# Patient Record
Sex: Male | Born: 1985 | Race: White | Hispanic: No | Marital: Single | Smoking: Current every day smoker
Health system: Southern US, Community
[De-identification: ages and names within clinical notes are randomized; demographics above are authoritative.]

---
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
## 2011-08-10 ENCOUNTER — Observation Stay (HOSPITAL_COMMUNITY): Payer: BC Managed Care – PPO

## 2011-08-10 ENCOUNTER — Observation Stay (HOSPITAL_COMMUNITY)
Admission: EM | Admit: 2011-08-10 | Discharge: 2011-08-10 | Disposition: A | Discharge status: Home / Self Care | Payer: BC Managed Care – PPO | Source: Ambulatory Visit | Attending: Emergency Medicine | Admitting: Emergency Medicine

## 2011-08-10 DIAGNOSIS — IMO0002 Reserved for concepts with insufficient information to code with codable children: Principal | ICD-10-CM | POA: Insufficient documentation

## 2011-08-10 LAB — POCT I-STAT, CHEM 8
BUN: 15 mg/dL (ref 6–23)
Calcium, Ion: 1.18 mmol/L (ref 1.12–1.32)
Chloride: 101 mEq/L (ref 96–112)
Creatinine, Ser: 1.5 mg/dL — ABNORMAL HIGH (ref 0.50–1.35)
Glucose, Bld: 92 mg/dL (ref 70–99)
HCT: 43 % (ref 39.0–52.0)
Hemoglobin: 14.6 g/dL (ref 13.0–17.0)
Potassium: 4.5 mEq/L (ref 3.5–5.1)
Sodium: 137 mEq/L (ref 135–145)
TCO2: 25 mmol/L (ref 0–100)

## 2011-08-10 LAB — CBC
HCT: 40.4 % (ref 39.0–52.0)
Hemoglobin: 14 g/dL (ref 13.0–17.0)
MCH: 32.3 pg (ref 26.0–34.0)
MCHC: 34.7 g/dL (ref 30.0–36.0)
MCV: 93.1 fL (ref 78.0–100.0)
Platelets: 186 10*3/uL (ref 150–400)
RBC: 4.34 MIL/uL (ref 4.22–5.81)
RDW: 12.9 % (ref 11.5–15.5)
WBC: 6.5 10*3/uL (ref 4.0–10.5)

## 2011-08-10 LAB — DIFFERENTIAL
Basophils Absolute: 0 10*3/uL (ref 0.0–0.1)
Basophils Relative: 1 % (ref 0–1)
Eosinophils Absolute: 0.2 10*3/uL (ref 0.0–0.7)
Eosinophils Relative: 2 % (ref 0–5)
Lymphocytes Relative: 16 % (ref 12–46)
Lymphs Abs: 1 10*3/uL (ref 0.7–4.0)
Monocytes Absolute: 0.6 10*3/uL (ref 0.1–1.0)
Monocytes Relative: 10 % (ref 3–12)
Neutro Abs: 4.7 10*3/uL (ref 1.7–7.7)
Neutrophils Relative %: 72 % (ref 43–77)

## 2011-08-13 LAB — WOUND CULTURE

## 2012-01-07 ENCOUNTER — Emergency Department (HOSPITAL_COMMUNITY)
Admission: EM | Admit: 2012-01-07 | Discharge: 2012-01-07 | Disposition: A | Discharge status: Home Health | Payer: BC Managed Care – PPO | Attending: Emergency Medicine | Admitting: Emergency Medicine

## 2012-01-07 ENCOUNTER — Encounter (HOSPITAL_COMMUNITY): Payer: Self-pay | Admitting: *Deleted

## 2012-01-07 DIAGNOSIS — IMO0002 Reserved for concepts with insufficient information to code with codable children: Secondary | ICD-10-CM | POA: Insufficient documentation

## 2012-01-07 DIAGNOSIS — F17200 Nicotine dependence, unspecified, uncomplicated: Secondary | ICD-10-CM | POA: Insufficient documentation

## 2012-01-07 DIAGNOSIS — M79609 Pain in unspecified limb: Secondary | ICD-10-CM | POA: Insufficient documentation

## 2012-01-07 DIAGNOSIS — M7989 Other specified soft tissue disorders: Secondary | ICD-10-CM | POA: Insufficient documentation

## 2012-01-07 DIAGNOSIS — L02519 Cutaneous abscess of unspecified hand: Secondary | ICD-10-CM | POA: Insufficient documentation

## 2012-01-07 DIAGNOSIS — L03019 Cellulitis of unspecified finger: Secondary | ICD-10-CM | POA: Insufficient documentation

## 2012-01-07 MED ORDER — CEPHALEXIN 250 MG PO CAPS
500.0000 mg | ORAL_CAPSULE | Freq: Once | ORAL | Status: AC
  Administered 2012-01-07: 500 mg via ORAL
  Filled 2012-01-07: qty 2

## 2012-01-07 MED ORDER — HYDROCODONE-ACETAMINOPHEN 5-325 MG PO TABS: 2.0000 | ORAL_TABLET | Freq: Four times a day (QID) | ORAL | Status: AC | PRN

## 2012-01-07 MED ORDER — CEPHALEXIN 500 MG PO CAPS: 500.0000 mg | ORAL_CAPSULE | Freq: Four times a day (QID) | ORAL | Status: AC

## 2012-01-07 MED ORDER — SULFAMETHOXAZOLE-TMP DS 800-160 MG PO TABS
2.0000 | ORAL_TABLET | Freq: Once | ORAL | Status: AC
  Administered 2012-01-07: 2 via ORAL
  Filled 2012-01-07: qty 2

## 2012-01-07 MED ORDER — HYDROCODONE-ACETAMINOPHEN 5-325 MG PO TABS
2.0000 | ORAL_TABLET | Freq: Once | ORAL | Status: AC
  Administered 2012-01-07: 2 via ORAL
  Filled 2012-01-07: qty 2

## 2012-01-07 MED ORDER — SULFAMETHOXAZOLE-TMP DS 800-160 MG PO TABS: 2.0000 | ORAL_TABLET | Freq: Two times a day (BID) | ORAL | Status: AC

## 2012-01-07 NOTE — ED Provider Notes (Signed)
History     CSN: [id number]  Arrival date & time 01/07/12  1458   First MD Initiated Contact with Patient 01/07/12 1603      Chief Complaint  Patient presents with  . Hand Pain    (Consider location/radiation/quality/duration/timing/severity/associated sxs/prior treatment) HPI Patient is a 26 yo M who presents today with complaint of increasing swelling and pain of his right little finger.  Patient was seen at urgent care for this several days ago and was started on bactrim 1 tab po BID.  He rates his pain as a 7/10 and notes increasing swelling and fluctuance.  He is able to flex and extend at the PIP and DIP.  He denies any systemic signs of infection.  Pain is worse with movement and palpation. There are no other associated or modifying factors.  History reviewed. No pertinent past medical history.  History reviewed. No pertinent past surgical history.  History reviewed. No pertinent family history.  History  Substance Use Topics  . Smoking status: Current Everyday Smoker    Types: Cigarettes  . Smokeless tobacco: Not on file  . Alcohol Use: No      Review of Systems  Constitutional: Negative.   HENT: Negative.   Eyes: Negative.   Respiratory: Negative.   Cardiovascular: Negative.   Gastrointestinal: Negative.   Genitourinary: Negative.   Musculoskeletal:       See HPI  Skin: Negative.   Neurological: Negative.   Hematological: Negative.   Psychiatric/Behavioral: Negative.   All other systems reviewed and are negative.    Allergies  Review of patient's allergies indicates no known allergies.  Home Medications   Current Outpatient Rx  Name Route Sig Dispense Refill  . IBUPROFEN 200 MG PO TABS Oral Take 400 mg by mouth every 8 (eight) hours as needed. For pain.    . SULFAMETHOXAZOLE-TMP DS 800-160 MG PO TABS Oral Take 1 tablet by mouth 2 (two) times daily. For 10 days Picked up 3/22    . CEPHALEXIN 500 MG PO CAPS Oral Take 1 capsule (500 mg total) by  mouth 4 (four) times daily. 40 capsule 0  . HYDROCODONE-ACETAMINOPHEN 5-325 MG PO TABS Oral Take 2 tablets by mouth every 6 (six) hours as needed for pain. 30 tablet 0  . SULFAMETHOXAZOLE-TMP DS 800-160 MG PO TABS Oral Take 2 tablets by mouth 2 (two) times daily. 40 tablet 0    BP 123/50  Pulse 58  Temp(Src) 98.4 F (36.9 C) (Oral)  Resp 14  Ht 5\' 11"  (1.803 m)  Wt 175 lb (79.379 kg)  BMI 24.41 kg/m2  SpO2 100%  Physical Exam  Nursing note and vitals reviewed. GEN: Well-developed, well-nourished male in no distress HEENT: Atraumatic, normocephalic.  EYES: PERRLA BL, no scleral icterus. NECK: Trachea midline, no meningismus CV: regular rate  PULM: No respiratory distress.   Neuro: cranial nerves 2-12 intact, no abnormalities of strength or sensation, A and O x 3 MSK: right hand with erythema distal to the DIP of the little finger. Fluctuance is appreciable just proximal to the edge of the nail bed.  There is also a small healed puncture wound on the radial aspect of the finger at the midpoint of the distal phalanx with underlying fluctuance and TTP Skin: see MSK Psych: no abnormality of mood   ED Course  Procedures (including critical care time)  INCISION AND DRAINAGE Performed by: Cyndra Numbers Consent: Verbal consent obtained. Risks and benefits: risks, benefits and alternatives were discussed Type: abscess  Body area:  right fifth finger  Anesthesia: digital block  Local anesthetic: lidocaine 2%   Anesthetic total: 3 ml  Complexity: simple Separation of the proximal nail fold at the nail bed was performed with only bloody drainage but improvement in swelling.  Incision was then made on radial aspect of the finger with return of small amount of pus.  No packing placed given that extending incision was felt unnecessary as swelling had improved and no further drainage could be expressed despite dissection. Blunt dissection to break up loculations  Drainage:  purulent  Drainage amount: minimal  Patient tolerance: Patient tolerated the procedure well with no immediate complications.   Labs Reviewed - No data to display No results found.   1. Cellulitis and abscess of finger   2. Felon       MDM  Presentation was consistent with worsening cellulitis and abscess.  Patient was not on correct antibiotic dosage.  He was started on bactrim DS 2 tabs po BID and keflex.  He had I and D performed.  He was instructed to soak in warm soapy water BID.  He was given pain meds and told to take all of his antibiotics.  The patient was discharged in good condition.     Cyndra Numbers, MD 01/08/12 1316

## 2012-01-07 NOTE — ED Notes (Signed)
Pt c/o infection in right pinky finger.  Went to urgent care two days ago and was given an antibiotic. Pt stated pressure and numbness is increasing and progressively getting worse. Pt denies injuring finger.

## 2012-01-07 NOTE — ED Notes (Signed)
Reports having infection to right little finger, went to u[facility] two days ago and started on antibiotics but no relief. Redness and swelling noted to finger.

## 2012-01-07 NOTE — Discharge Instructions (Signed)
Please soak your affected finger in warm soapy water twice a day for 20 minutes while wound is open. Please take all of your antibiotics. Whenever possible he should elevate your hand. Use ice as needed for symptoms as well. If you develop finger swelling, foul smelling discharge, increasing pain despite antibiotic therapy, or other concerning symptoms please return to the emergency department immediately. Fingertip Infection When an infection is around the nail, it is called a paronychia. When it appears over the tip of the finger, it is called a felon. These infections are due to minor injuries or cracks in the skin. If they are not treated properly, they can lead to bone infection and permanent damage to the fingernail. Incision and drainage is necessary if a pus pocket (an abscess) has formed. Antibiotics and pain medicine may also be needed. Keep your hand elevated for the next 2-3 days to reduce swelling and pain. If a pack was placed in the abscess, it should be removed in 1-2 days by your caregiver. Soak the finger in warm water for 20 minutes 4 times daily to help promote drainage. Keep the hands as dry as possible. Wear protective gloves with cotton liners. See your caregiver for follow-up care as recommended.  HOME CARE INSTRUCTIONS   Keep wound clean, dry and dressed as suggested by your caregiver.   Soak in warm salt water for fifteen minutes, four times per day for bacterial infections.   Your caregiver will prescribe an antibiotic if a bacterial infection is suspected. Take antibiotics as directed and finish the prescription, even if the problem appears to be improving before the medicine is gone.   Only take over-the-counter or prescription medicines for pain, discomfort, or fever as directed by your caregiver.  SEEK IMMEDIATE MEDICAL CARE IF:  There is redness, swelling, or increasing pain in the wound.   Pus or any other unusual drainage is coming from the wound.   An  unexplained oral temperature above 102 F (38.9 C) develops.   You notice a foul smell coming from the wound or dressing.  MAKE SURE YOU:   Understand these instructions.   Monitor your condition.   Contact your caregiver if you are getting worse or not improving.  Document Released: 11/09/2004 Document Revised: 09/21/2011 Document Reviewed: 11/05/2008 [facility] Patient Information 2012 [place], [place].

## 2018-02-26 DIAGNOSIS — K625 Hemorrhage of anus and rectum: Secondary | ICD-10-CM | POA: Diagnosis not present

## 2018-02-26 DIAGNOSIS — Z1389 Encounter for screening for other disorder: Secondary | ICD-10-CM | POA: Diagnosis not present

## 2018-02-26 DIAGNOSIS — Z136 Encounter for screening for cardiovascular disorders: Secondary | ICD-10-CM | POA: Diagnosis not present

## 2018-02-26 DIAGNOSIS — Z0000 Encounter for general adult medical examination without abnormal findings: Secondary | ICD-10-CM | POA: Diagnosis not present

## 2020-11-09 ENCOUNTER — Ambulatory Visit
Admission: EM | Admit: 2020-11-09 | Discharge: 2020-11-09 | Disposition: A | Discharge status: Home / Self Care | Payer: 59 | Attending: Emergency Medicine | Admitting: Emergency Medicine

## 2020-11-09 ENCOUNTER — Ambulatory Visit (INDEPENDENT_AMBULATORY_CARE_PROVIDER_SITE_OTHER): Payer: 59

## 2020-11-09 ENCOUNTER — Encounter: Payer: Self-pay | Admitting: Emergency Medicine

## 2020-11-09 ENCOUNTER — Other Ambulatory Visit: Payer: Self-pay

## 2020-11-09 DIAGNOSIS — W208XXA Other cause of strike by thrown, projected or falling object, initial encounter: Secondary | ICD-10-CM | POA: Diagnosis not present

## 2020-11-09 DIAGNOSIS — M79671 Pain in right foot: Secondary | ICD-10-CM | POA: Diagnosis not present

## 2020-11-09 DIAGNOSIS — S92424A Nondisplaced fracture of distal phalanx of right great toe, initial encounter for closed fracture: Secondary | ICD-10-CM

## 2020-11-09 NOTE — ED Triage Notes (Signed)
Pt states that yesterday morning he was doing house work and slipped on some wood and dropped the corner of the counter top on his right foot. Pt right big toe is bruised and swollen.

## 2020-11-09 NOTE — ED Provider Notes (Signed)
[facility]    CSN: [id number] Arrival date & time: 11/09/20  0955      History   Chief Complaint Chief Complaint  Patient presents with  . Foot Injury    HPI Danny Burch is a 35 y.o. male  Present for right great toe injury that occurred yesterday.  States that he dropped a counter top on his foot while doing renovations.  No open wound, bleeding, numbness or deformity.  Has had history of fractures, though this feels different.  History reviewed. No pertinent past medical history.  There are no problems to display for this patient.   History reviewed. No pertinent surgical history.     Home Medications    Prior to Admission medications   Medication Sig Start Date End Date Taking? Authorizing Provider  ibuprofen (ADVIL,MOTRIN) 200 MG tablet Take 400 mg by mouth every 8 (eight) hours as needed. For pain.    [provider]    Family History History reviewed. No pertinent family history.  Social History Social History   Tobacco Use  . Smoking status: Current Every Day Smoker    Types: Cigarettes  . Smokeless tobacco: Never Used  Substance Use Topics  . Alcohol use: No  . Drug use: No     Allergies   Patient has no known allergies.   Review of Systems Review of Systems  Constitutional: Negative for fatigue and fever.  Respiratory: Negative for cough and shortness of breath.   Cardiovascular: Negative for chest pain and palpitations.  Gastrointestinal: Negative for abdominal pain, diarrhea and vomiting.  Musculoskeletal: Negative for arthralgias and myalgias.       (+) R great toe pain  Skin: Negative for rash and wound.  Neurological: Negative for speech difficulty and headaches.  All other systems reviewed and are negative.    Physical Exam Triage Vital Signs ED Triage Vitals  Enc Vitals Group     BP 11/09/20 1021 131/82     Pulse Rate 11/09/20 1021 76     Resp 11/09/20 1021 17     Temp 11/09/20 1021 97.6 F  (36.4 C)     Temp Source 11/09/20 1021 Oral     SpO2 11/09/20 1021 98 %     Weight --      Height --      Head Circumference --      Peak Flow --      Pain Score 11/09/20 1019 4     Pain Loc --      Pain Edu? --      Excl. in GC? --    No data found.  Updated Vital Signs BP 131/82 (BP Location: Left Arm)   Pulse 76   Temp 97.6 F (36.4 C) (Oral)   Resp 17   SpO2 98%   Visual Acuity Right Eye Distance:   Left Eye Distance:   Bilateral Distance:    Right Eye Near:   Left Eye Near:    Bilateral Near:     Physical Exam Constitutional:      General: He is not in acute distress. HENT:     Head: Normocephalic and atraumatic.  Eyes:     General: No scleral icterus.    Pupils: Pupils are equal, round, and reactive to light.  Cardiovascular:     Rate and Rhythm: Normal rate.  Pulmonary:     Effort: Pulmonary effort is normal. No respiratory distress.     Breath sounds: No wheezing.  Musculoskeletal:  General: Swelling and tenderness present. No deformity.     Comments: R great toe NVI  Skin:    Coloration: Skin is not jaundiced or pale.     Findings: Bruising present.  Neurological:     General: No focal deficit present.     Mental Status: He is alert and oriented to person, place, and time.      UC Treatments / Results  Labs (all labs ordered are listed, but only abnormal results are displayed) Labs Reviewed - No data to display  EKG   Radiology DG Foot Complete Right  Result Date: 11/09/2020 CLINICAL DATA:  Dropped heavy object on foot EXAM: RIGHT FOOT COMPLETE - 3+ VIEW COMPARISON:  None. FINDINGS: Frontal, oblique, and lateral views were obtained. There is a fracture of the distal aspect of the first distal phalanx with mild distal displacement of the distal fracture fragment. There is a fracture along the lateral aspect of the proximal portion of the first distal phalanx extending into the lateral aspect of the first IP joint. Alignment in this  area is near anatomic. No other fractures are evident. No dislocation. Joint spaces appear normal. No erosive change. IMPRESSION: Fracture of the lateral proximal aspect of the first distal phalanx with fracture fragment extending into the lateral aspect of the first IP joint. There is also a fracture of the distal aspect of the first distal phalanx with displacement of fracture fragments. No other fractures are evident.  No dislocation.  No arthropathy. These results will be called to the ordering clinician or representative by the Radiologist Assistant, and communication documented in the PACS or [employer]. Electronically Signed   By: Bretta Bang III M.D.   On: 11/09/2020 11:21    Procedures Procedures (including critical care time)  Medications Ordered in UC Medications - No data to display  Initial Impression / Assessment and Plan / UC Course  I have reviewed the triage vital signs and the nursing notes.  Pertinent labs & imaging results that were available during my care of the patient were reviewed by me and considered in my medical decision making (see chart for details).     X-ray as above: Given crutches in office; unfortunately unable to provide postop shoe that fits.  Instructed follow-up closely with orthopedics and be nonweightbearing in the interim.  Return precautions discussed, pt verbalized understanding and is agreeable to plan. Final Clinical Impressions(s) / UC Diagnoses   Final diagnoses:  Closed nondisplaced fracture of distal phalanx of right great toe, initial encounter   Discharge Instructions   None    ED Prescriptions    None     PDMP not reviewed this encounter.   Hall[place], [place], [place] 11/09/20 1140

## 2021-08-08 IMAGING — DX DG FOOT COMPLETE 3+V*R*
3 series · 3 of 3 positions shown · non-contrast
Comparison: None.

CLINICAL DATA: Dropped heavy object on foot

EXAM:
RIGHT FOOT COMPLETE - 3+ VIEW

[foot supine dp]
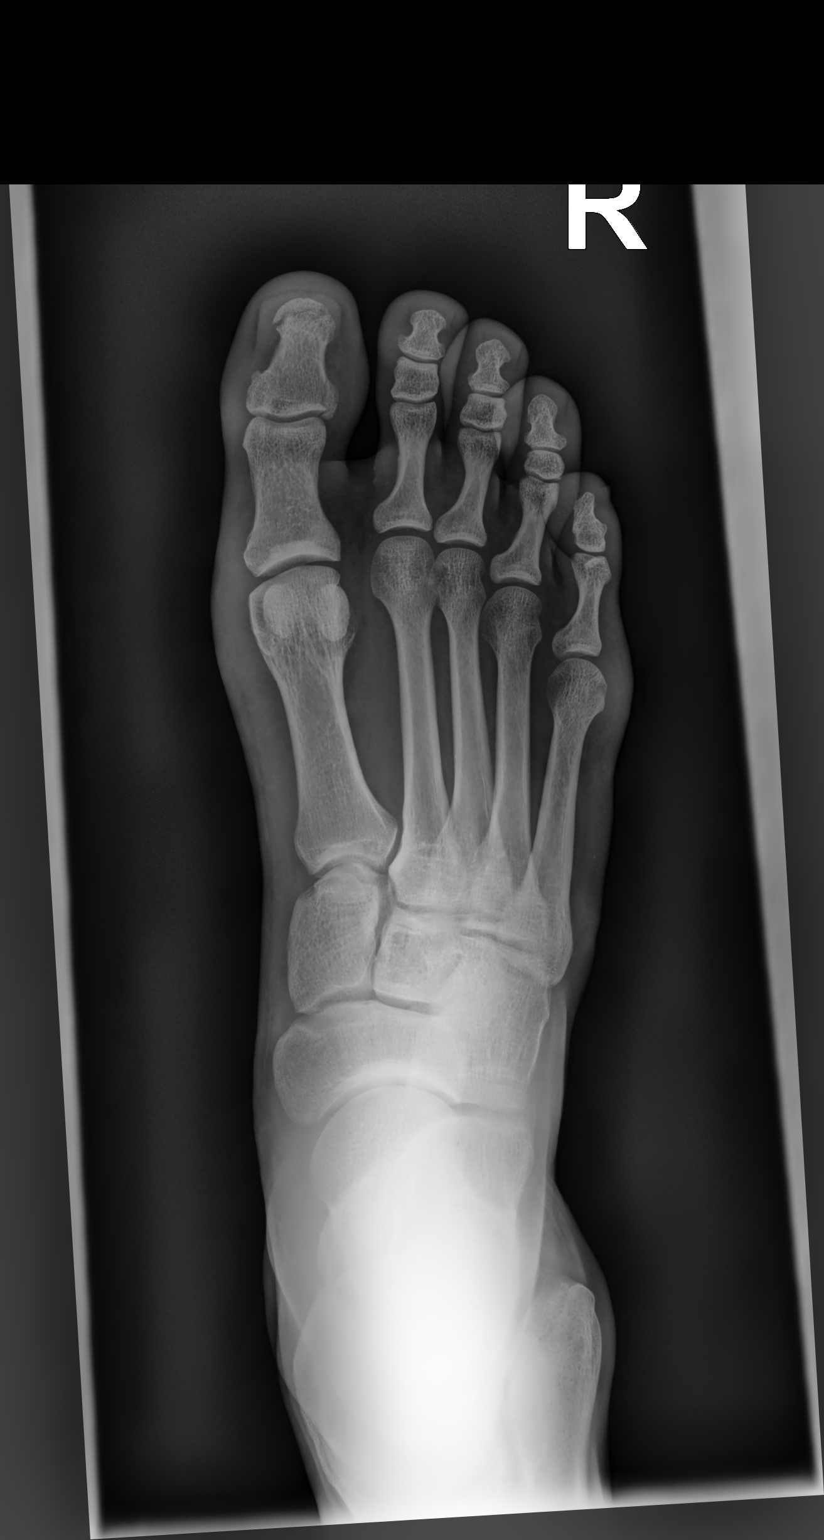

[foot medial oblique]
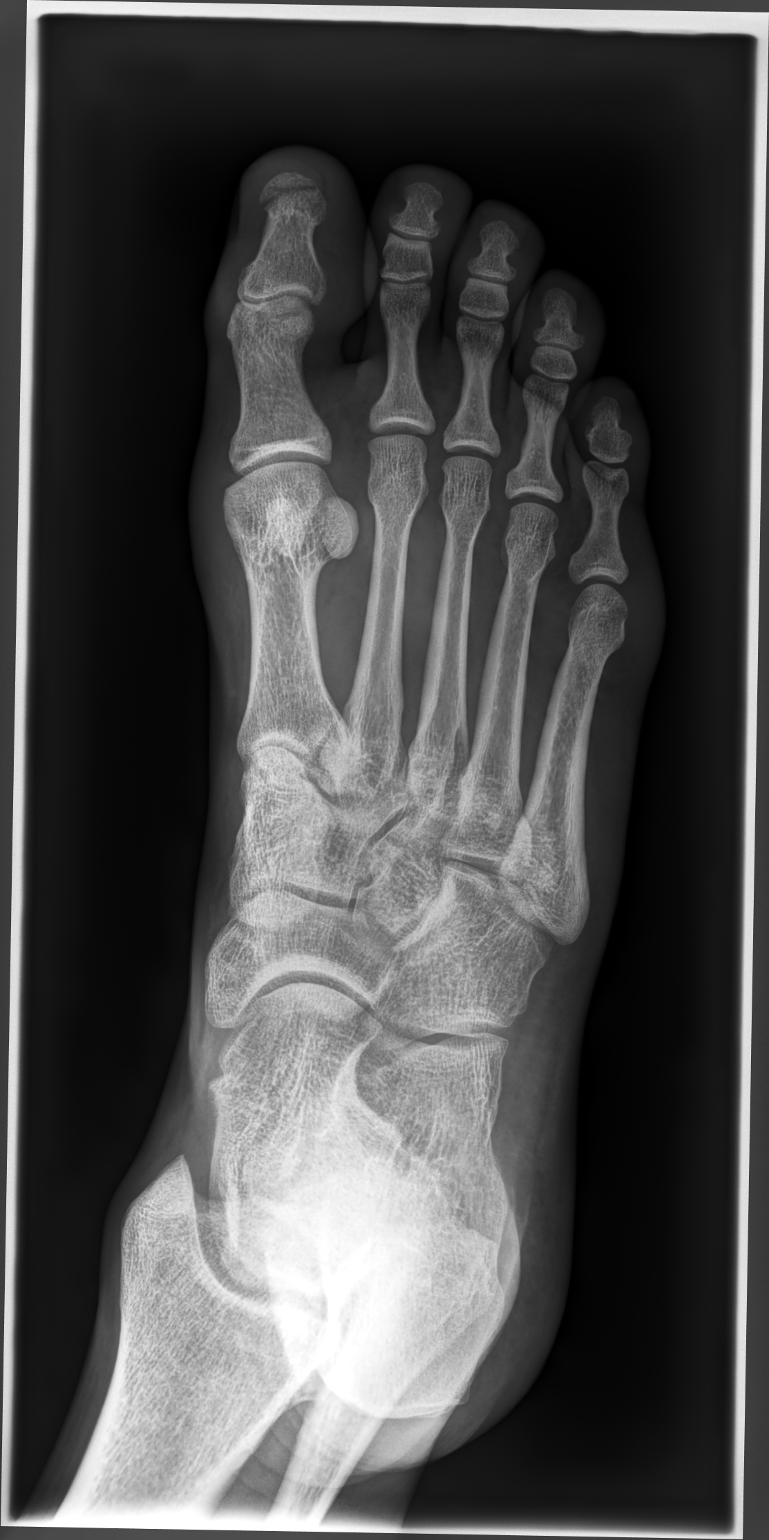

[foot supine lat]
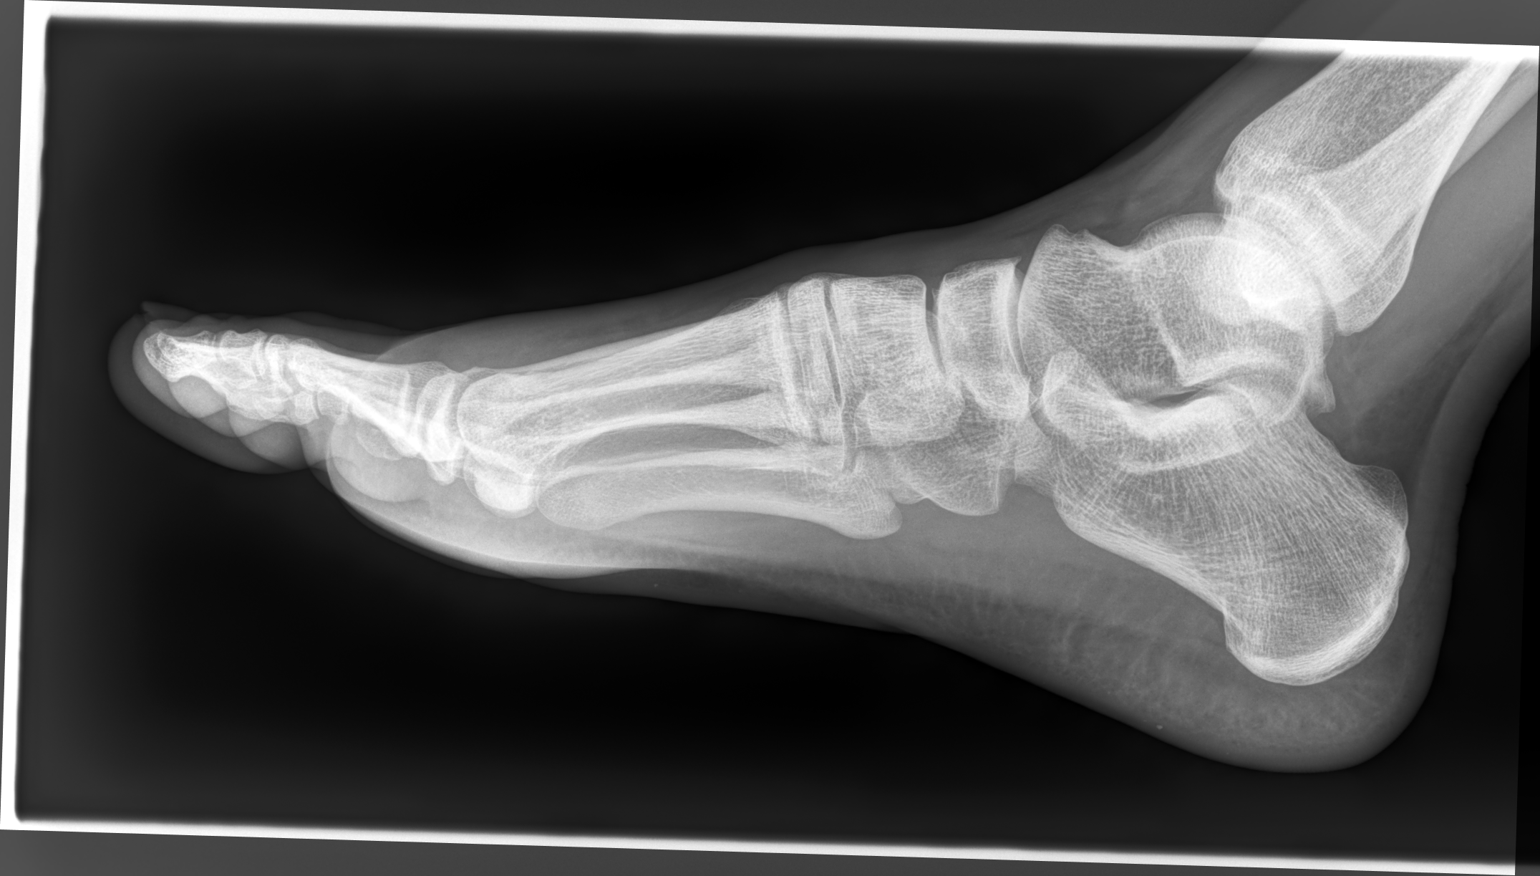

[3 of 3 positions shown; findings below may reference images not displayed]

FINDINGS: Frontal, oblique, and lateral views were obtained. There is a
fracture of the distal aspect of the first distal phalanx with mild
distal displacement of the distal fracture fragment. There is a
fracture along the lateral aspect of the proximal portion of the
first distal phalanx extending into the lateral aspect of the first
IP joint. Alignment in this area is near anatomic. No other
fractures are evident. No dislocation. Joint spaces appear normal.
No erosive change.
IMPRESSION: Fracture of the lateral proximal aspect of the first distal phalanx
with fracture fragment extending into the lateral aspect of the
first IP joint. There is also a fracture of the distal aspect of the
first distal phalanx with displacement of fracture fragments.

No other fractures are evident.  No dislocation.  No arthropathy.

These results will be called to the ordering clinician or
representative by the Radiologist Assistant, and communication
documented in the PACS or [REDACTED].
